# Patient Record
Sex: Female | Born: 2000
Health system: Southern US, Community
[De-identification: ages and names within clinical notes are randomized; demographics above are authoritative.]

## PROBLEM LIST (undated history)

## (undated) DIAGNOSIS — H6092 Unspecified otitis externa, left ear: Secondary | ICD-10-CM

## (undated) DIAGNOSIS — T7840XA Allergy, unspecified, initial encounter: Secondary | ICD-10-CM

## (undated) DIAGNOSIS — E559 Vitamin D deficiency, unspecified: Secondary | ICD-10-CM

## (undated) DIAGNOSIS — F419 Anxiety disorder, unspecified: Secondary | ICD-10-CM

## (undated) HISTORY — DX: Vitamin D deficiency, unspecified: E55.9

## (undated) HISTORY — PX: MANDIBLE SURGERY: SHX707

## (undated) HISTORY — DX: Allergy, unspecified, initial encounter: T78.40XA

## (undated) HISTORY — DX: Anxiety disorder, unspecified: F41.9

## (undated) HISTORY — PX: COSMETIC SURGERY: SHX468

---
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
## 2018-04-12 DIAGNOSIS — M26213 Malocclusion, Angle's class III: Secondary | ICD-10-CM | POA: Diagnosis not present

## 2018-04-12 DIAGNOSIS — M2603 Mandibular hyperplasia: Secondary | ICD-10-CM | POA: Diagnosis not present

## 2018-04-12 DIAGNOSIS — M2602 Maxillary hypoplasia: Secondary | ICD-10-CM | POA: Diagnosis not present

## 2018-04-12 DIAGNOSIS — M26621 Arthralgia of right temporomandibular joint: Secondary | ICD-10-CM | POA: Diagnosis not present

## 2019-06-08 ENCOUNTER — Other Ambulatory Visit: Payer: Self-pay

## 2019-06-08 ENCOUNTER — Ambulatory Visit (INDEPENDENT_AMBULATORY_CARE_PROVIDER_SITE_OTHER): Payer: Commercial Managed Care - PPO | Admitting: Adult Health Nurse Practitioner

## 2019-06-08 ENCOUNTER — Encounter: Payer: Self-pay | Admitting: Adult Health Nurse Practitioner

## 2019-06-08 DIAGNOSIS — Z23 Encounter for immunization: Secondary | ICD-10-CM

## 2019-06-08 DIAGNOSIS — H60332 Swimmer's ear, left ear: Secondary | ICD-10-CM

## 2019-06-08 DIAGNOSIS — H6092 Unspecified otitis externa, left ear: Secondary | ICD-10-CM | POA: Insufficient documentation

## 2019-06-08 HISTORY — DX: Unspecified otitis externa, left ear: H60.92

## 2019-06-08 MED ORDER — NEOMYCIN-POLYMYXIN-HC 1 % OT SOLN: 3.0000 [drp] | Freq: Four times a day (QID) | OTIC | 0 refills | Status: DC

## 2019-06-08 NOTE — Patient Instructions (Signed)
Otitis Externa  Otitis externa is an infection of the outer ear canal. The outer ear canal is the area between the outside of the ear and the eardrum. Otitis externa is sometimes called swimmer's ear. What are the causes? Common causes of this condition include:  Swimming in dirty water.  Moisture in the ear.  An injury to the inside of the ear.  An object stuck in the ear.  A cut or scrape on the outside of the ear. What increases the risk? You are more likely to develop this condition if you go swimming often. What are the signs or symptoms? The first symptom of this condition is often itching in the ear. Later symptoms of the condition include:  Swelling of the ear.  Redness in the ear.  Ear pain. The pain may get worse when you pull on your ear.  Pus coming from the ear. How is this diagnosed? This condition may be diagnosed by examining the ear and testing fluid from the ear for bacteria and funguses. How is this treated? This condition may be treated with:  Antibiotic ear drops. These are often given for 10-14 days.  Medicines to reduce itching and swelling. Follow these instructions at home:  If you were prescribed antibiotic ear drops, use them as told by your health care provider. Do not stop using the antibiotic even if your condition improves.  Take over-the-counter and prescription medicines only as told by your health care provider.  Avoid getting water in your ears as told by your health care provider. This may include avoiding swimming or water sports for a few days.  Keep all follow-up visits as told by your health care provider. This is important. How is this prevented?  Keep your ears dry. Use the corner of a towel to dry your ears after you swim or bathe.  Avoid scratching or putting things in your ear. Doing these things can damage the ear canal or remove the protective wax that lines it, which makes it easier for bacteria and funguses to grow.   Avoid swimming in lakes, polluted water, or pools that may not have enough chlorine. Contact a health care provider if:  You have a fever.  Your ear is still red, swollen, painful, or draining pus after 3 days.  Your redness, swelling, or pain gets worse.  You have a severe headache.  You have redness, swelling, pain, or tenderness in the area behind your ear. Summary  Otitis externa is an infection of the outer ear canal.  Common causes include swimming in dirty water, moisture in the ear, or a cut or scrape in the ear.  Symptoms include pain, redness, and swelling of the ear.  If you were prescribed antibiotic ear drops, use them as told by your health care provider. Do not stop using the antibiotic even if your condition improves. This information is not intended to replace advice given to you by your health care provider. Make sure you discuss any questions you have with your health care provider. Document Released: 08/25/2005 Document Revised: 01/29/2018 Document Reviewed: 01/29/2018 Elsevier Patient Education  2020 Elsevier Inc.  

## 2019-06-08 NOTE — Progress Notes (Signed)
Acute Office Visit  Subjective:    Patient ID: Mary Benitez, female    DOB: [birth year], 18 y.o.   MRN: [id number]  Chief Complaint  Patient presents with  . Otalgia    L ear, started 09/19, ear drops were applied    HPI Patient is in today for left ear pain over the past week.  It feels a little better this a.m. Feels her hearing has diminished somewhat.   No fever, chills, night sweats.  No sore throat.  No sinus pain, pressure, or drainage.  No loss of taste or smell.   Past Medical History:  Diagnosis Date  . External otitis of left ear 06/08/2019    No past surgical history on file.  Family History  Problem Relation Age of Onset  . Hypertension Maternal Grandmother   . Hypertension Maternal Grandfather     Social History   Socioeconomic History  . Marital status: Single    Spouse name: Not on file  . Number of children: Not on file  . Years of education: Not on file  . Highest education level: Not on file  Occupational History  . Not on file  Social Needs  . Financial resource strain: Not on file  . Food insecurity    Worry: Not on file    Inability: Not on file  . Transportation needs    Medical: Not on file    Non-medical: Not on file  Tobacco Use  . Smoking status: Never Smoker  . Smokeless tobacco: Never Used  Substance and Sexual Activity  . Alcohol use: No  . Drug use: No  . Sexual activity: Not on file  Lifestyle  . Physical activity    Days per week: Not on file    Minutes per session: Not on file  . Stress: Not on file  Relationships  . Social Herbalist on phone: Not on file    Gets together: Not on file    Attends religious service: Not on file    Active member of club or organization: Not on file    Attends meetings of clubs or organizations: Not on file    Relationship status: Not on file  . Intimate partner violence    Fear of current or ex partner: Not on file    Emotionally abused: Not on file    Physically abused:  Not on file    Forced sexual activity: Not on file  Other Topics Concern  . Not on file  Social History Narrative  . Not on file    No outpatient medications prior to visit.   No facility-administered medications prior to visit.     No Known Allergies  Review of Systems  Constitutional: Negative.  Negative for chills, diaphoresis and fever.  HENT: Positive for ear pain and hearing loss. Negative for congestion, ear discharge, sinus pain and sore throat.   Eyes: Negative.   Respiratory: Negative.   Skin: Negative.          Objective:    Physical Exam  Constitutional: She appears well-developed and well-nourished.  HENT:  Head: Normocephalic and atraumatic.  Right Ear: Hearing, tympanic membrane and external ear normal.  Left Ear: No tenderness. Tympanic membrane is retracted. A middle ear effusion is present.  Eyes: Conjunctivae, EOM and lids are normal. Lids are everted and swept, no foreign bodies found.  Cardiovascular: Normal rate, regular rhythm, S1 normal and S2 normal.  Pulmonary/Chest: Effort normal. No respiratory distress.  BP 128/83 (BP Location: Right Arm, Patient Position: Sitting, Cuff Size: Normal)   Pulse (!) 130   Temp 97.9 F (36.6 C) (Oral)   Resp 18   Ht _0  (1.575 m)   Wt 118 lb 12.8 oz (53.9 kg)   LMP 05/13/2019   SpO2 98%   BMI 21.73 kg/m  Wt Readings from Last 3 Encounters:  06/08/19 118 lb 12.8 oz (53.9 kg)  09/10/17 123 lb 9.6 oz (56.1 kg) (46 %, Z= -0.09)*  01/05/17 123 lb (55.8 kg) (48 %, Z= -0.04)*   * Growth percentiles are based on CDC (Girls, 2-20 Years) data.    Health Maintenance Due  Topic Date Due  . HIV Screening  01/21/2014  . TETANUS/TDAP  01/21/2018  . INFLUENZA VACCINE  04/09/2019    There are no preventive care reminders to display for this patient.   No results found for: TSH No results found for: WBC, HGB, HCT, MCV, PLT No results found for: NA, K, CHLORIDE, CO2, GLUCOSE, BUN, CREATININE, BILITOT,  ALKPHOS, AST, ALT, PROT, ALBUMIN, CALCIUM, ANIONGAP, EGFR, GFR No results found for: CHOL No results found for: HDL No results found for: LDLCALC No results found for: TRIG No results found for: CHOLHDL No results found for: HGBA1C     Assessment & Plan:   Problem List Items Addressed This Visit      Nervous and Auditory   External otitis of left ear (Chronic)    Other Visit Diagnoses    Need for immunization against influenza       Relevant Orders   Flu Vaccine QUAD 36+ mos IM (Completed)       Meds ordered this encounter  Medications  . NEOMYCIN-POLYMYXIN-HYDROCORTISONE (CORTISPORIN) 1 % SOLN OTIC solution    Sig: Place 3 drops into the left ear 4 (four) times daily.    Dispense:  10 mL    Refill:  0    Will try external Otic solution.  Should have some improvement within 48 hours.  If no improvement, patient to call and we will consider oral antibiotic. Patient and father are inline with this plan.    Glyn Ade, NP

## 2021-12-12 NOTE — Progress Notes (Signed)
? ?New Patient Office Visit ? ?Subjective:  ?Patient ID: Mary Benitez, female    DOB: 2001-03-26  Age: 21 y.o. MRN: [id number] ? ?CC:  ?Chief Complaint  ?Patient presents with  ? Establish Care  ?  Np. Est care. Pt c/o frequent cluster headaches for several months. Pt is fasting  ? ? ?HPI ?Mary Benitez presents for new patient visit to establish care.  Introduced to Publishing rights manager role and practice setting.  All questions answered.  Discussed provider/patient relationship and expectations. ? ?She has been having frequent headaches. They tend to occur during the week. She also works third shift. She states that she has a poor diet. She sleeps with her hair tied in a tight bun. Last week the pain was behind her right eye, however they generally tend to be across her forehead. She takes aspirin throughout the week for the headaches. The headaches started about a year ago. She recently visited the eye doctor and had her eye glass prescription changed. Denies sensitivity to light and sound, nausea, vomiting, and aura. Describes the pain as a band around her forehead. She did have a headache earlier this morning, however it went away. She states that her sleep is not best working third shift and it is hard for her to stay asleep.  ? ?She has a history of anxiety since high school. She does get anxiety attacks, but not frequently. She was never taking medication or seeing a therapist. She does not feel like her symptoms affect her daily life. Anxiety tends to be situational, like with her last interview. She has tried CBD gummies which helps some.  ? ? ?  12/16/2021  ? 11:09 AM 06/08/2019  ? 11:19 AM 09/10/2017  ?  2:12 PM 01/05/2017  ?  5:17 PM  ?Depression screen PHQ 2/9  ?Decreased Interest 0 0 0 0  ?Down, Depressed, Hopeless 0 0 0 0  ?PHQ - 2 Score 0 0 0 0  ?Altered sleeping 1 0    ?Tired, decreased energy 2 0    ?Change in appetite 3 0    ?Feeling bad or failure about yourself  0 0    ?Trouble concentrating 0 0     ?Moving slowly or fidgety/restless 0 0    ?Suicidal thoughts 0 0    ?PHQ-9 Score 6 0    ?Difficult doing work/chores Somewhat difficult Not difficult at all    ? ? ?  12/16/2021  ? 11:09 AM  ?GAD 7 : Generalized Anxiety Score  ?Nervous, Anxious, on Edge 1  ?Control/stop worrying 0  ?Worry too much - different things 1  ?Trouble relaxing 0  ?Restless 0  ?Easily annoyed or irritable 0  ?Afraid - awful might happen 0  ?Total GAD 7 Score 2  ?Anxiety Difficulty Somewhat difficult  ? ? ?Past Medical History:  ?Diagnosis Date  ? External otitis of left ear 06/08/2019  ? Vitamin D deficiency   ? ? ?Past Surgical History:  ?Procedure Laterality Date  ? MANDIBLE SURGERY    ? ? ?Family History  ?Problem Relation Age of Onset  ? Hypertension Maternal Grandmother   ? Hypertension Maternal Grandfather   ? ? ?Social History  ? ?Socioeconomic History  ? Marital status: Single  ?  Spouse name: Not on file  ? Number of children: Not on file  ? Years of education: Not on file  ? Highest education level: Not on file  ?Occupational History  ? Not on file  ?Tobacco Use  ?  Smoking status: Never  ? Smokeless tobacco: Never  ?Vaping Use  ? Vaping Use: Never used  ?Substance and Sexual Activity  ? Alcohol use: Yes  ?  Comment: occasionally  ? Drug use: No  ? Sexual activity: Never  ?Other Topics Concern  ? Not on file  ?Social History Narrative  ? Not on file  ? ?Social Determinants of Health  ? ?Financial Resource Strain: Not on file  ?Food Insecurity: Not on file  ?Transportation Needs: Not on file  ?Physical Activity: Not on file  ?Stress: Not on file  ?Social Connections: Not on file  ?Intimate Partner Violence: Not on file  ? ? ?ROS ?Review of Systems  ?Constitutional:  Positive for fatigue.  ?HENT: Negative.    ?Respiratory: Negative.    ?Cardiovascular: Negative.   ?Gastrointestinal: Negative.   ?Genitourinary: Negative.   ?Musculoskeletal: Negative.   ?Skin: Negative.   ?Neurological:  Positive for headaches. Negative for dizziness.   ?Psychiatric/Behavioral:  Positive for sleep disturbance (works 3rd shift). The patient is nervous/anxious.   ? ?Objective:  ? ?Today's Vitals: BP 133/81 (BP Location: Left Arm, Patient Position: Sitting, Cuff Size: Normal)   Pulse 88   Temp (!) 96.5 ?F (35.8 ?C) (Temporal)   Ht 5\' 2"  (1.575 m)   Wt 125 lb 6.4 oz (56.9 kg)   LMP 12/07/2021 (Exact Date)   SpO2 100%   BMI 22.94 kg/m?  ? ?Physical Exam ?Vitals and nursing note reviewed.  ?Constitutional:   ?   General: She is not in acute distress. ?   Appearance: Normal appearance.  ?HENT:  ?   Head: Normocephalic and atraumatic.  ?   Right Ear: Tympanic membrane, ear canal and external ear normal.  ?   Left Ear: Tympanic membrane, ear canal and external ear normal.  ?   Nose: Nose normal.  ?   Mouth/Throat:  ?   Mouth: Mucous membranes are moist.  ?   Pharynx: Oropharynx is clear.  ?Eyes:  ?   Conjunctiva/sclera: Conjunctivae normal.  ?Cardiovascular:  ?   Rate and Rhythm: Normal rate and regular rhythm.  ?   Pulses: Normal pulses.  ?   Heart sounds: Normal heart sounds.  ?Pulmonary:  ?   Effort: Pulmonary effort is normal.  ?   Breath sounds: Normal breath sounds.  ?Abdominal:  ?   Palpations: Abdomen is soft.  ?   Tenderness: There is no abdominal tenderness. There is no guarding or rebound.  ?Musculoskeletal:     ?   General: Normal range of motion.  ?   Cervical back: Normal range of motion. No tenderness.  ?   Right lower leg: No edema.  ?   Left lower leg: No edema.  ?Lymphadenopathy:  ?   Cervical: No cervical adenopathy.  ?Skin: ?   General: Skin is warm and dry.  ?Neurological:  ?   General: No focal deficit present.  ?   Mental Status: She is alert and oriented to person, place, and time.  ?   Cranial Nerves: No cranial nerve deficit.  ?   Coordination: Coordination normal.  ?   Gait: Gait normal.  ?   Deep Tendon Reflexes: Reflexes normal.  ?Psychiatric:     ?   Mood and Affect: Mood normal.     ?   Behavior: Behavior normal.     ?   Thought  Content: Thought content normal.     ?   Judgment: Judgment normal.  ? ? ?Assessment & Plan:  ? ?  Problem List Items Addressed This Visit   ? ?  ? Other  ? Tension headache  ?  Chronic, ongoing.  Her symptoms are consistent with tension headaches.  Discussed sleep hygiene, getting enough sleep, eating well, exercise, ways to reduce her stress.  She can take ibuprofen or Tylenol as needed for pain.  Handout provided as well.  Follow-up in 3 months or sooner with concerns. ?  ?  ? Vitamin D deficiency  ?  She is taking an over-the-counter vitamin D supplement when she remembers.  We will check vitamin D levels today and adjust regimen based on results ?  ?  ? Relevant Orders  ? VITAMIN D 25 Hydroxy (Vit-D Deficiency, Fractures)  ? Anxiety  ?  Has a history anxiety, that is more situational in nature.  Her PHQ-9 is a 6 and her GAD-7 is a 2.  She denies SI/HI.  She has been controlling her anxiety without medication since high school.  Follow-up with any concerns or worsening symptoms. ?  ?  ? ?Other Visit Diagnoses   ? ? Routine general medical examination at a health care facility    -  Primary  ? Health maintenance reviewed and updated. Tetanus updated today. Discussed diet and exercise. Follow up 1 year  ? Relevant Orders  ? CBC with Differential/Platelet  ? Comprehensive metabolic panel  ? Encounter for lipid screening for cardiovascular disease      ? Screen baseline lipid panel today  ? Relevant Orders  ? Lipid panel  ? Fatigue, unspecified type      ? Discussed sleep hygiene and taking melatonin prn to help with sleep. She does work 3rd shift. Check CMP, CBC, TSH today.   ? Relevant Orders  ? TSH  ? History of non anemic vitamin B12 deficiency      ? Check vitamin B-12 today and treat based on results  ? Relevant Orders  ? Vitamin B12  ? Screening for HIV (human immunodeficiency virus)      ? Check HIV today  ? Relevant Orders  ? HIV Antibody (routine testing w rflx)  ? Encounter for hepatitis C screening test  for low risk patient      ? Check hepatitis C today  ? Relevant Orders  ? Hepatitis C antibody  ? ?  ? ?LABORATORY TESTING:  ?- Pap smear:  declined ? ?IMMUNIZATIONS:   ?- Tdap: Tetanus vaccination status reviewed: Td v

## 2021-12-16 ENCOUNTER — Ambulatory Visit (INDEPENDENT_AMBULATORY_CARE_PROVIDER_SITE_OTHER): Payer: Commercial Managed Care - PPO | Admitting: Nurse Practitioner

## 2021-12-16 ENCOUNTER — Encounter: Payer: Self-pay | Admitting: Nurse Practitioner

## 2021-12-16 VITALS — BP 133/81 | HR 88 | Temp 96.5°F | Ht 62.0 in | Wt 125.4 lb

## 2021-12-16 DIAGNOSIS — F419 Anxiety disorder, unspecified: Secondary | ICD-10-CM | POA: Diagnosis not present

## 2021-12-16 DIAGNOSIS — R5383 Other fatigue: Secondary | ICD-10-CM

## 2021-12-16 DIAGNOSIS — Z23 Encounter for immunization: Secondary | ICD-10-CM

## 2021-12-16 DIAGNOSIS — Z136 Encounter for screening for cardiovascular disorders: Secondary | ICD-10-CM

## 2021-12-16 DIAGNOSIS — Z1159 Encounter for screening for other viral diseases: Secondary | ICD-10-CM

## 2021-12-16 DIAGNOSIS — Z13220 Encounter for screening for lipoid disorders: Secondary | ICD-10-CM | POA: Diagnosis not present

## 2021-12-16 DIAGNOSIS — E559 Vitamin D deficiency, unspecified: Secondary | ICD-10-CM | POA: Diagnosis not present

## 2021-12-16 DIAGNOSIS — Z0000 Encounter for general adult medical examination without abnormal findings: Secondary | ICD-10-CM | POA: Diagnosis not present

## 2021-12-16 DIAGNOSIS — G44209 Tension-type headache, unspecified, not intractable: Secondary | ICD-10-CM

## 2021-12-16 DIAGNOSIS — Z8639 Personal history of other endocrine, nutritional and metabolic disease: Secondary | ICD-10-CM

## 2021-12-16 DIAGNOSIS — Z1329 Encounter for screening for other suspected endocrine disorder: Secondary | ICD-10-CM

## 2021-12-16 DIAGNOSIS — Z114 Encounter for screening for human immunodeficiency virus [HIV]: Secondary | ICD-10-CM

## 2021-12-16 LAB — CBC WITH DIFFERENTIAL/PLATELET
Basophils Absolute: 0.1 10*3/uL (ref 0.0–0.1)
Basophils Relative: 0.8 % (ref 0.0–3.0)
Eosinophils Absolute: 0 10*3/uL (ref 0.0–0.7)
Eosinophils Relative: 0.7 % (ref 0.0–5.0)
HCT: 40.4 % (ref 36.0–46.0)
Hemoglobin: 13.6 g/dL (ref 12.0–15.0)
Lymphocytes Relative: 49.9 % — ABNORMAL HIGH (ref 12.0–46.0)
Lymphs Abs: 3.2 10*3/uL (ref 0.7–4.0)
MCHC: 33.7 g/dL (ref 30.0–36.0)
MCV: 89.6 fl (ref 78.0–100.0)
Monocytes Absolute: 0.3 10*3/uL (ref 0.1–1.0)
Monocytes Relative: 5 % (ref 3.0–12.0)
Neutro Abs: 2.8 10*3/uL (ref 1.4–7.7)
Neutrophils Relative %: 43.6 % (ref 43.0–77.0)
Platelets: 242 10*3/uL (ref 150.0–400.0)
RBC: 4.51 Mil/uL (ref 3.87–5.11)
RDW: 12.4 % (ref 11.5–15.5)
WBC: 6.4 10*3/uL (ref 4.0–10.5)

## 2021-12-16 LAB — COMPREHENSIVE METABOLIC PANEL
ALT: 13 U/L (ref 0–35)
AST: 19 U/L (ref 0–37)
Albumin: 4.9 g/dL (ref 3.5–5.2)
Alkaline Phosphatase: 51 U/L (ref 39–117)
BUN: 12 mg/dL (ref 6–23)
CO2: 23 mEq/L (ref 19–32)
Calcium: 10.4 mg/dL (ref 8.4–10.5)
Chloride: 102 mEq/L (ref 96–112)
Creatinine, Ser: 0.73 mg/dL (ref 0.40–1.20)
GFR: 116.34 mL/min (ref >60.00)
Glucose, Bld: 79 mg/dL (ref 70–99)
Potassium: 3.5 mEq/L (ref 3.5–5.1)
Sodium: 137 mEq/L (ref 135–145)
Total Bilirubin: 0.4 mg/dL (ref 0.2–1.2)
Total Protein: 8 g/dL (ref 6.0–8.3)

## 2021-12-16 LAB — TSH: TSH: 0.97 u[IU]/mL (ref 0.35–5.50)

## 2021-12-16 LAB — LIPID PANEL
Cholesterol: 166 mg/dL (ref 0–200)
HDL: 52.1 mg/dL (ref >39.00)
LDL Cholesterol: 98 mg/dL (ref 0–99)
NonHDL: 114.2
Total CHOL/HDL Ratio: 3
Triglycerides: 79 mg/dL (ref 0.0–149.0)
VLDL: 15.8 mg/dL (ref 0.0–40.0)

## 2021-12-16 LAB — VITAMIN D 25 HYDROXY (VIT D DEFICIENCY, FRACTURES): VITD: 25.16 ng/mL — ABNORMAL LOW (ref 30.00–100.00)

## 2021-12-16 LAB — VITAMIN B12: Vitamin B-12: 215 pg/mL (ref 211–911)

## 2021-12-16 NOTE — Assessment & Plan Note (Signed)
Chronic, ongoing.  Her symptoms are consistent with tension headaches.  Discussed sleep hygiene, getting enough sleep, eating well, exercise, ways to reduce her stress.  She can take ibuprofen or Tylenol as needed for pain.  Handout provided as well.  Follow-up in 3 months or sooner with concerns. ?

## 2021-12-16 NOTE — Patient Instructions (Addendum)
It was great to see you! ? ?See the attached information on tension headaches. Trying to make sure you get enough sleep, eat right and exercise can help prevent the headaches. You can try melatonin to help you sleep. Make sure your room is dark especially since you work third shift and sleep during the day.  ? ?We are checking your labs today and will call you with the results.  ? ?Let's follow-up in 3 months, sooner if you have concerns. ? ?If a referral was placed today, you will be contacted for an appointment. Please note that routine referrals can sometimes take up to 3-4 weeks to process. Please call our office if you haven't heard anything after this time frame. ? ?Take care, ? ?Rodman Pickle, NP ? ?

## 2021-12-16 NOTE — Assessment & Plan Note (Signed)
Has a history anxiety, that is more situational in nature.  Her PHQ-9 is a 6 and her GAD-7 is a 2.  She denies SI/HI.  She has been controlling her anxiety without medication since high school.  Follow-up with any concerns or worsening symptoms. ?

## 2021-12-16 NOTE — Assessment & Plan Note (Signed)
She is taking an over-the-counter vitamin D supplement when she remembers.  We will check vitamin D levels today and adjust regimen based on results ?

## 2021-12-17 LAB — HEPATITIS C ANTIBODY
Hepatitis C Ab: NONREACTIVE
SIGNAL TO CUT-OFF: 0.06 (ref <1.00)

## 2021-12-17 LAB — HIV ANTIBODY (ROUTINE TESTING W REFLEX): HIV 1&2 Ab, 4th Generation: NONREACTIVE

## 2021-12-23 ENCOUNTER — Telehealth: Payer: Self-pay | Admitting: Nurse Practitioner

## 2021-12-23 NOTE — Telephone Encounter (Signed)
Pt is wanting a cb concerning her most recent lab results. Please advise pt at [phone]. ?

## 2021-12-23 NOTE — Telephone Encounter (Signed)
Patient notified VIA phone regarding lab results and recommendations.  No questions. Dm/cma ? ?

## 2021-12-30 ENCOUNTER — Ambulatory Visit
Admission: EM | Admit: 2021-12-30 | Discharge: 2021-12-30 | Disposition: A | Discharge status: Home / Self Care | Payer: Commercial Managed Care - PPO | Attending: Urgent Care | Admitting: Urgent Care

## 2021-12-30 DIAGNOSIS — L249 Irritant contact dermatitis, unspecified cause: Secondary | ICD-10-CM

## 2021-12-30 MED ORDER — HYDROXYZINE HCL 25 MG PO TABS: 12.5000 mg | ORAL_TABLET | Freq: Three times a day (TID) | ORAL | 0 refills | Status: DC | PRN

## 2021-12-30 MED ORDER — BETAMETHASONE DIPROPIONATE 0.05 % EX OINT: TOPICAL_OINTMENT | Freq: Two times a day (BID) | CUTANEOUS | 0 refills | Status: DC

## 2021-12-30 NOTE — ED Provider Notes (Signed)
?  [facility] ? ? ?MRN: [id number] DOB: 10-Sep-2000 ? ?Subjective:  ? ?Mary Benitez is a 21 y.o. female presenting for 2-week history of persistent itchy rash over the left dorsum of her foot.  Patient works in a wooded area, does night shifts.  Cannot recall of any particular inciting event but has exposure to a lot of allergens and insects, animals.  Denies any drainage of pus or bleeding, tenderness. ? ?No current facility-administered medications for this encounter. ?No current outpatient medications on file.  ? ?No Known Allergies ? ?Past Medical History:  ?Diagnosis Date  ? External otitis of left ear 06/08/2019  ? Vitamin D deficiency   ?  ? ?Past Surgical History:  ?Procedure Laterality Date  ? MANDIBLE SURGERY    ? ? ?Family History  ?Problem Relation Age of Onset  ? Hypertension Maternal Grandmother   ? Hypertension Maternal Grandfather   ? ? ?Social History  ? ?Tobacco Use  ? Smoking status: Never  ? Smokeless tobacco: Never  ?Vaping Use  ? Vaping Use: Never used  ?Substance Use Topics  ? Alcohol use: Yes  ?  Comment: occasionally  ? Drug use: No  ? ? ?ROS ? ? ?Objective:  ? ?Vitals: ?BP 127/87 (BP Location: Left Arm)   Pulse 77   Temp 97.8 ?F (36.6 ?C) (Oral)   Resp 17   LMP 12/07/2021 (Exact Date)   SpO2 95%  ? ?Physical Exam ?Constitutional:   ?   General: She is not in acute distress. ?   Appearance: Normal appearance. She is well-developed. She is not ill-appearing, toxic-appearing or diaphoretic.  ?HENT:  ?   Head: Normocephalic and atraumatic.  ?   Nose: Nose normal.  ?   Mouth/Throat:  ?   Mouth: Mucous membranes are moist.  ?Eyes:  ?   General: No scleral icterus.    ?   Right eye: No discharge.     ?   Left eye: No discharge.  ?   Extraocular Movements: Extraocular movements intact.  ?Cardiovascular:  ?   Rate and Rhythm: Normal rate.  ?Pulmonary:  ?   Effort: Pulmonary effort is normal.  ?Skin: ?   General: Skin is warm and dry.  ? ?    ?Neurological:  ?   General: No focal  deficit present.  ?   Mental Status: She is alert and oriented to person, place, and time.  ?Psychiatric:     ?   Mood and Affect: Mood normal.     ?   Behavior: Behavior normal.  ? ?Assessment and Plan :  ? ?PDMP not reviewed this encounter. ? ?1. Irritant contact dermatitis, unspecified trigger   ? ?Recommended topical treatment for an irritant dermatitis with betamethasone ointment.  Use hydroxyzine for itching. Counseled patient on potential for adverse effects with medications prescribed/recommended today, ER and return-to-clinic precautions discussed, patient verbalized understanding. ? ?  ?Wallis Bamberg, PA-C ?12/30/21 1941 ? ?

## 2021-12-30 NOTE — ED Triage Notes (Signed)
2wk pruritic red rash on dorsal surface of left foot with swelling. Pt notes a similar lesion is on her hand, but appears to be improving better than the rash on her foot. Has been using insect bite cream w/o relief. Tried benadryl w/o itch relief. ?

## 2022-03-17 ENCOUNTER — Encounter: Payer: Self-pay | Admitting: Nurse Practitioner

## 2022-03-17 ENCOUNTER — Ambulatory Visit (INDEPENDENT_AMBULATORY_CARE_PROVIDER_SITE_OTHER): Payer: Commercial Managed Care - PPO | Admitting: Nurse Practitioner

## 2022-03-17 VITALS — BP 114/80 | HR 77 | Temp 97.2°F | Wt 129.6 lb

## 2022-03-17 DIAGNOSIS — Z111 Encounter for screening for respiratory tuberculosis: Secondary | ICD-10-CM | POA: Diagnosis not present

## 2022-03-17 DIAGNOSIS — G44209 Tension-type headache, unspecified, not intractable: Secondary | ICD-10-CM | POA: Diagnosis not present

## 2022-03-17 DIAGNOSIS — Z0184 Encounter for antibody response examination: Secondary | ICD-10-CM | POA: Diagnosis not present

## 2022-03-17 NOTE — Patient Instructions (Signed)
It was great to see you!  We are checking your labs today and will let you know the results via mychart/phone.   Good luck with dental assistant school!   Let's follow-up in 1 year, sooner if you have concerns.  If a referral was placed today, you will be contacted for an appointment. Please note that routine referrals can sometimes take up to 3-4 weeks to process. Please call our office if you haven't heard anything after this time frame.  Take care,  Rodman Pickle, NP

## 2022-03-17 NOTE — Assessment & Plan Note (Addendum)
Her headaches have decreased in frequency since her last visit, especially since she stopped working at her prior job.  She was working third shift and is now trying to get back on normal sleeping hours.  She states she did have a headache last night, however it went away this morning.  She can continue to take ibuprofen or Tylenol as needed for pain.  Follow-up if symptoms worsen or with any concerns

## 2022-03-17 NOTE — Progress Notes (Signed)
   Established Patient Office Visit  Subjective   Patient ID: Mary Benitez, female    DOB: 30-Mar-2001  Age: 21 y.o. MRN: [id number]  Chief Complaint  Patient presents with   Follow-up    3 mo f/u tension headaches, insomnia     HPI  Mary Benitez is here to follow-up on tension headaches.   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  She states that she still is having some trouble sleeping, however she is in the process of changing back to a normal sleeping hours.  She had a headache last night into this morning, however it is gone now.    ROS See pertinent positives and negatives per HPI.    Objective:     BP 114/80   Pulse 77   Temp (!) 97.2 F (36.2 C) (Temporal)   Wt 129 lb 9.6 oz (58.8 kg)   LMP 03/08/2022 (Exact Date)   SpO2 100%   BMI 23.70 kg/m    Physical Exam Vitals and nursing note reviewed.  Constitutional:      General: She is not in acute distress.    Appearance: Normal appearance.  HENT:     Head: Normocephalic.  Eyes:     Conjunctiva/sclera: Conjunctivae normal.  Cardiovascular:     Rate and Rhythm: Normal rate and regular rhythm.     Pulses: Normal pulses.     Heart sounds: Normal heart sounds.  Pulmonary:     Effort: Pulmonary effort is normal.     Breath sounds: Normal breath sounds.  Musculoskeletal:     Cervical back: Normal range of motion.  Skin:    General: Skin is warm.  Neurological:     General: No focal deficit present.     Mental Status: She is alert and oriented to person, place, and time.  Psychiatric:        Mood and Affect: Mood normal.        Behavior: Behavior normal.        Thought Content: Thought content normal.        Judgment: Judgment normal.      Assessment & Plan:   Problem List Items Addressed This Visit       Other   Tension headache - Primary    Her headaches have decreased in frequency since her last visit, especially  since she stopped working at her prior job.  She was working third shift and is now trying to get back on normal sleeping hours.  She states she did have a headache last night, however it went away this morning.  She can continue to take ibuprofen or Tylenol as needed for pain.  Follow-up if symptoms worsen or with any concerns      Other Visit Diagnoses     Immunity status testing       We will check hepatitis B surface antibody for school requirements.   Relevant Orders   Hepatitis B surface antibody,qualitative   Screening-pulmonary TB       We will check QuantiFERON gold to rule out TB for school requirements.   Relevant Orders   QuantiFERON-TB Gold Plus       Return in about 1 year (around 03/18/2023) for CPE.    Gerre Scull, NP

## 2022-03-19 ENCOUNTER — Encounter: Payer: Self-pay | Admitting: Nurse Practitioner

## 2022-03-19 NOTE — Telephone Encounter (Signed)
Called and spoke to pt. Pt is scheduled for lab visit for Hep B booster vaccine. Sw, cma

## 2022-03-20 ENCOUNTER — Ambulatory Visit (INDEPENDENT_AMBULATORY_CARE_PROVIDER_SITE_OTHER): Payer: Commercial Managed Care - PPO

## 2022-03-20 DIAGNOSIS — Z23 Encounter for immunization: Secondary | ICD-10-CM | POA: Diagnosis not present

## 2022-03-20 NOTE — Progress Notes (Signed)
Per orders of Alene Mires NP, pt is here for Hep B Vaccine Booster Dose 1. pt received in Left deltoid at 10:50 am. Given by [place] L. CMA/CPT. Pt tolerated He B vaccine Booster Dose 1 well. Pt will return 1 mo from today for 2nd dose booster.

## 2022-03-21 LAB — QUANTIFERON-TB GOLD PLUS
Mitogen-NIL: 5.09 IU/mL
NIL: 0.01 IU/mL
QuantiFERON-TB Gold Plus: NEGATIVE
TB1-NIL: 0 IU/mL
TB2-NIL: 0 IU/mL

## 2022-03-21 LAB — HEPATITIS B SURFACE ANTIBODY,QUALITATIVE: Hep B S Ab: NONREACTIVE

## 2022-03-21 NOTE — Progress Notes (Signed)
Lab results, provider instructions and comments viewed by patient via mychart.

## 2022-04-22 ENCOUNTER — Ambulatory Visit (INDEPENDENT_AMBULATORY_CARE_PROVIDER_SITE_OTHER): Payer: Commercial Managed Care - PPO

## 2022-04-22 DIAGNOSIS — Z23 Encounter for immunization: Secondary | ICD-10-CM

## 2022-04-22 NOTE — Progress Notes (Addendum)
After obtaining consent, and per orders of Lauren McElwee, injection of Hep B#2 given by [place]. Patient instructed to remain in clinic for 20 minutes afterwards, and to report any adverse reaction to me immediately.

## 2022-06-17 ENCOUNTER — Ambulatory Visit: Payer: Commercial Managed Care - PPO

## 2022-09-23 ENCOUNTER — Ambulatory Visit: Payer: Commercial Managed Care - PPO

## 2022-09-25 ENCOUNTER — Ambulatory Visit: Payer: Commercial Managed Care - PPO

## 2022-09-25 ENCOUNTER — Telehealth: Payer: Self-pay

## 2022-09-25 DIAGNOSIS — Z23 Encounter for immunization: Secondary | ICD-10-CM | POA: Diagnosis not present

## 2022-09-25 NOTE — Addendum Note (Signed)
Addended by: Alphonzo Lemmings on: 09/25/2022 04:12 PM   Modules accepted: Orders

## 2022-09-25 NOTE — Telephone Encounter (Signed)
LVM for pt to return my call at the office.

## 2022-09-26 ENCOUNTER — Ambulatory Visit: Payer: Commercial Managed Care - PPO

## 2022-09-26 DIAGNOSIS — Z23 Encounter for immunization: Secondary | ICD-10-CM

## 2022-09-26 NOTE — Progress Notes (Signed)
Pt was erroneously given Hepatitis B Energix-B 17mcg/5ml (Adolescent dose) IM in the left deltoid by Alinda Dooms [place]. PCP made  After obtaining consent, and per orders of Lauren McElwee, injection of Hepatitis B Energix-B 48mcg/5ml (Adolescent dose) given IM in the right deltoid by Alinda Dooms [place]. Patient instructed to remain in clinic for 20 minutes afterwards, and to report any adverse reaction to me immediately. This 2nd dose of 27mcg gives the patient adequate immunization, for a total of 1mcg administered.

## 2022-09-30 NOTE — Progress Notes (Signed)
error 

## 2024-05-06 ENCOUNTER — Encounter: Payer: Self-pay | Admitting: Nurse Practitioner

## 2024-06-10 ENCOUNTER — Encounter: Payer: Self-pay | Admitting: Nurse Practitioner

## 2024-06-10 ENCOUNTER — Ambulatory Visit (INDEPENDENT_AMBULATORY_CARE_PROVIDER_SITE_OTHER): Admitting: Nurse Practitioner

## 2024-06-10 VITALS — BP 126/72 | HR 101 | Temp 97.1°F | Ht 62.0 in | Wt 130.6 lb

## 2024-06-10 DIAGNOSIS — Z136 Encounter for screening for cardiovascular disorders: Secondary | ICD-10-CM

## 2024-06-10 DIAGNOSIS — Z0000 Encounter for general adult medical examination without abnormal findings: Secondary | ICD-10-CM | POA: Insufficient documentation

## 2024-06-10 DIAGNOSIS — F419 Anxiety disorder, unspecified: Secondary | ICD-10-CM | POA: Diagnosis not present

## 2024-06-10 DIAGNOSIS — E559 Vitamin D deficiency, unspecified: Secondary | ICD-10-CM

## 2024-06-10 DIAGNOSIS — Z23 Encounter for immunization: Secondary | ICD-10-CM

## 2024-06-10 MED ORDER — SERTRALINE HCL 25 MG PO TABS: 25.0000 mg | ORAL_TABLET | Freq: Every day | ORAL | 1 refills | Status: DC

## 2024-06-10 MED ORDER — COVID-19 MRNA VACC (MODERNA) 50 MCG/0.5ML IM SUSY: 0.5000 mL | PREFILLED_SYRINGE | Freq: Once | INTRAMUSCULAR | 0 refills | Status: AC

## 2024-06-10 NOTE — Assessment & Plan Note (Signed)
 Check vitamin D and treat based on results. She is not currently taking any supplements

## 2024-06-10 NOTE — Patient Instructions (Signed)
 It was great to see you!  We are checking your labs today and will let you know the results via mychart/phone.   Start zoloft 1 tablet daily for anxiety  I have placed a referral to a therapist   Let's follow-up in 4-6 weeks, sooner if you have concerns.  If a referral was placed today, you will be contacted for an appointment. Please note that routine referrals can sometimes take up to 3-4 weeks to process. Please call our office if you haven't heard anything after this time frame.  Take care,  Tinnie Harada, NP

## 2024-06-10 NOTE — Progress Notes (Signed)
 BP 126/72 (BP Location: Left Arm, Patient Position: Sitting, Cuff Size: Normal)   Pulse (!) 101   Temp (!) 97.1 F (36.2 C)   Ht 5' 2 (1.575 m)   Wt 130 lb 9.6 oz (59.2 kg)   LMP 05/16/2024 (Exact Date)   SpO2 99%   BMI 23.89 kg/m    Subjective:    Patient ID: Mary Benitez, female    DOB: Apr 30, 2001, 23 y.o.   MRN: [id number]  CC: Chief Complaint  Patient presents with   Annual Exam    With fasting labs, no concerns, Covid Rx    HPI: Mary Benitez is a 23 y.o. female presenting on 06/10/2024 for comprehensive medical examination. Current medical complaints include:Anxiety  Anxiety has worsened recently and prevents her from doing some activities like making phone calls and applying for job. She denies trouble sleeping and panic attacks. She has tried CBD gummies which has helped a little. She doesn't take these every day.   She currently lives with: parents Menopausal Symptoms: no  Depression and Anxiety Screen done today and results listed below:     06/10/2024    2:15 PM 12/16/2021   11:09 AM 06/08/2019   11:19 AM 09/10/2017    2:12 PM 01/05/2017    5:17 PM  Depression screen PHQ 2/9  Decreased Interest 0 0 0 0 0  Down, Depressed, Hopeless 1 0 0 0 0  PHQ - 2 Score 1 0 0 0 0  Altered sleeping 0 1 0    Tired, decreased energy 1 2 0    Change in appetite 0 3 0    Feeling bad or failure about yourself  0 0 0    Trouble concentrating 0 0 0    Moving slowly or fidgety/restless 0 0 0    Suicidal thoughts 0 0 0    PHQ-9 Score 2 6 0    Difficult doing work/chores Somewhat difficult Somewhat difficult Not difficult at all        06/10/2024    2:15 PM 12/16/2021   11:09 AM  GAD 7 : Generalized Anxiety Score  Nervous, Anxious, on Edge  1  Control/stop worrying 0 0  Worry too much - different things 1 1  Trouble relaxing 0 0  Restless 0 0  Easily annoyed or irritable 1 0  Afraid - awful might happen 0 0  Total GAD 7 Score  2  Anxiety Difficulty Somewhat difficult  Somewhat difficult    The patient does not have a history of falls. I did not complete a risk assessment for falls. A plan of care for falls was not documented.   Past Medical History:  Past Medical History:  Diagnosis Date   Allergy    Seasonal   Anxiety    External otitis of left ear 06/08/2019   Vitamin D  deficiency     Surgical History:  Past Surgical History:  Procedure Laterality Date   COSMETIC SURGERY  August 5th, 2019   Dental / Jaw surgery   MANDIBLE SURGERY      Medications:  Current Outpatient Medications on File Prior to Visit  Medication Sig   Multiple Vitamin (MULTIVITAMIN ADULT PO) Take by mouth.   No current facility-administered medications on file prior to visit.    Allergies:  No Known Allergies  Social History:  Social History   Socioeconomic History   Marital status: Single    Spouse name: Not on file   Number of children: Not on file  Years of education: Not on file   Highest education level: Associate degree: academic program  Occupational History   Not on file  Tobacco Use   Smoking status: Never   Smokeless tobacco: Never  Vaping Use   Vaping status: Never Used  Substance and Sexual Activity   Alcohol use: Yes    Comment: Rare / On special occasions   Drug use: No   Sexual activity: Never  Other Topics Concern   Not on file  Social History Narrative   Not on file   Social Drivers of Health   Financial Resource Strain: Low Risk  (06/06/2024)   Overall Financial Resource Strain (CARDIA)    Difficulty of Paying Living Expenses: Not hard at all  Food Insecurity: No Food Insecurity (06/06/2024)   Hunger Vital Sign    Worried About Running Out of Food in the Last Year: Never true    Ran Out of Food in the Last Year: Never true  Transportation Needs: No Transportation Needs (06/06/2024)   PRAPARE - Administrator, Civil Service (Medical): No    Lack of Transportation (Non-Medical): No  Physical Activity: Inactive  (06/06/2024)   Exercise Vital Sign    Days of Exercise per Week: 0 days    Minutes of Exercise per Session: Not on file  Stress: Stress Concern Present (06/06/2024)   [employer] of Occupational Health - Occupational Stress Questionnaire    Feeling of Stress: To some extent  Social Connections: Socially Isolated (06/06/2024)   Social Connection and Isolation Panel    Frequency of Communication with Friends and Family: Never    Frequency of Social Gatherings with Friends and Family: Never    Attends Religious Services: Never    Diplomatic Services operational officer: No    Attends Engineer, structural: Not on file    Marital Status: Never married  Catering manager Violence: Not on file   Social History   Tobacco Use  Smoking Status Never  Smokeless Tobacco Never   Social History   Substance and Sexual Activity  Alcohol Use Yes   Comment: Rare / On special occasions    Family History:  Family History  Problem Relation Age of Onset   Hypertension Maternal Grandmother    Hypertension Maternal Grandfather     Past medical history, surgical history, medications, allergies, family history and social history reviewed with patient today and changes made to appropriate areas of the chart.   Review of Systems  Constitutional:  Positive for malaise/fatigue. Negative for fever.  HENT: Negative.    Eyes: Negative.   Respiratory: Negative.    Cardiovascular: Negative.   Gastrointestinal: Negative.   Genitourinary: Negative.   Musculoskeletal: Negative.   Skin: Negative.   Neurological:  Positive for headaches. Negative for dizziness.  Psychiatric/Behavioral:  Negative for depression. The patient is nervous/anxious. The patient does not have insomnia.    All other ROS negative except what is listed above and in the HPI.      Objective:    BP 126/72 (BP Location: Left Arm, Patient Position: Sitting, Cuff Size: Normal)   Pulse (!) 101   Temp (!) 97.1 F (36.2 C)    Ht 5' 2 (1.575 m)   Wt 130 lb 9.6 oz (59.2 kg)   LMP 05/16/2024 (Exact Date)   SpO2 99%   BMI 23.89 kg/m   Wt Readings from Last 3 Encounters:  06/10/24 130 lb 9.6 oz (59.2 kg)  03/17/22 129 lb 9.6 oz (58.8  kg)  12/16/21 125 lb 6.4 oz (56.9 kg)    Physical Exam Vitals and nursing note reviewed.  Constitutional:      General: She is not in acute distress.    Appearance: Normal appearance.  HENT:     Head: Normocephalic and atraumatic.     Right Ear: Tympanic membrane, ear canal and external ear normal.     Left Ear: Tympanic membrane, ear canal and external ear normal.     Mouth/Throat:     Mouth: Mucous membranes are moist.     Pharynx: No posterior oropharyngeal erythema.  Eyes:     Conjunctiva/sclera: Conjunctivae normal.  Cardiovascular:     Rate and Rhythm: Normal rate and regular rhythm.     Pulses: Normal pulses.     Heart sounds: Normal heart sounds.  Pulmonary:     Effort: Pulmonary effort is normal.     Breath sounds: Normal breath sounds.  Abdominal:     Palpations: Abdomen is soft.     Tenderness: There is no abdominal tenderness.  Musculoskeletal:        General: Normal range of motion.     Cervical back: Normal range of motion and neck supple.     Right lower leg: No edema.     Left lower leg: No edema.  Lymphadenopathy:     Cervical: No cervical adenopathy.  Skin:    General: Skin is warm and dry.  Neurological:     General: No focal deficit present.     Mental Status: She is alert and oriented to person, place, and time.     Cranial Nerves: No cranial nerve deficit.     Coordination: Coordination normal.     Gait: Gait normal.  Psychiatric:        Mood and Affect: Mood normal.        Behavior: Behavior normal.        Thought Content: Thought content normal.        Judgment: Judgment normal.     Results for orders placed or performed in visit on 03/17/22  Hepatitis B surface antibody,qualitative   Collection Time: 03/17/22  9:34 AM   Result Value Ref Range   Hep B S Ab NON-REACTIVE NON-REACTIVE  QuantiFERON-TB Gold Plus   Collection Time: 03/17/22  9:34 AM  Result Value Ref Range   QuantiFERON-TB Gold Plus NEGATIVE NEGATIVE   NIL 0.01 IU/mL   Mitogen-NIL 5.09 IU/mL   TB1-NIL 0.00 IU/mL   TB2-NIL 0.00 IU/mL      Assessment & Plan:   Problem List Items Addressed This Visit       Other   Vitamin D  deficiency   Check vitamin D  and treat based on results. She is not currently taking any supplements.       Relevant Orders   VITAMIN D  25 Hydroxy (Vit-D Deficiency, Fractures)   Anxiety   Chronic, not controlled. Anxiety has worsened lately, although she denies panic attacks. Start zoloft 25mg  daily and referral placed to therapy. Discussed possible side effects. Check TSH today. Follow-up in 4-6 weeks.       Relevant Medications   sertraline (ZOLOFT) 25 MG tablet   Other Relevant Orders   Ambulatory referral to Psychology   TSH   Routine general medical examination at a health care facility - Primary   Health maintenance reviewed and updated. Discussed nutrition, exercise. Check CMP, CBC today. Follow-up 1 year.        Relevant Orders   CBC with Differential/Platelet  Comprehensive metabolic panel with GFR   Other Visit Diagnoses       Screening for cardiovascular condition       Screen lipid panel today.   Relevant Orders   Lipid panel     Immunization due       Covid-19 vaccine RX printed.   Relevant Medications   COVID-19 mRNA vaccine [facility]) syringe        Follow up plan: Return in about 4 weeks (around 07/08/2024) for 4-6 weeks , Anxiety.   LABORATORY TESTING:  - Pap smear: done elsewhere  IMMUNIZATIONS:   - Tdap: Tetanus vaccination status reviewed: last tetanus booster within 10 years. - Influenza: Declined - Pneumovax: Not applicable - Prevnar: Not applicable - HPV: Up to date - Shingrix vaccine: Not applicable  SCREENING: -Mammogram: Not applicable  - Colonoscopy:  Not applicable  - Bone Density: Not applicable   PATIENT COUNSELING:   Advised to take 1 mg of folate supplement per day if capable of pregnancy.   Sexuality: Discussed sexually transmitted diseases, partner selection, use of condoms, avoidance of unintended pregnancy  and contraceptive alternatives.   Advised to avoid cigarette smoking.  I discussed with the patient that most people either abstain from alcohol or drink within safe limits (<=14/week and <=4 drinks/occasion for males, <=7/weeks and <= 3 drinks/occasion for females) and that the risk for alcohol disorders and other health effects rises proportionally with the number of drinks per week and how often a drinker exceeds daily limits.  Discussed cessation/primary prevention of drug use and availability of treatment for abuse.   Diet: Encouraged to adjust caloric intake to maintain  or achieve ideal body weight, to reduce intake of dietary saturated fat and total fat, to limit sodium intake by avoiding high sodium foods and not adding table salt, and to maintain adequate dietary potassium and calcium preferably from fresh fruits, vegetables, and low-fat dairy products.    stressed the importance of regular exercise  Injury prevention: Discussed safety belts, safety helmets, smoke detector, smoking near bedding or upholstery.   Dental health: Discussed importance of regular tooth brushing, flossing, and dental visits.    NEXT PREVENTATIVE PHYSICAL DUE IN 1 YEAR. Return in about 4 weeks (around 07/08/2024) for 4-6 weeks , Anxiety.  Minoru Chap A Delonna Ney

## 2024-06-10 NOTE — Assessment & Plan Note (Signed)
 Chronic, not controlled. Anxiety has worsened lately, although she denies panic attacks. Start zoloft 25mg  daily and referral placed to therapy. Discussed possible side effects. Check TSH today. Follow-up in 4-6 weeks.

## 2024-06-10 NOTE — Assessment & Plan Note (Signed)
 Health maintenance reviewed and updated. Discussed nutrition, exercise. Check CMP, CBC today. Follow-up 1 year.

## 2024-06-11 LAB — COMPREHENSIVE METABOLIC PANEL WITH GFR
AG Ratio: 1.7 (calc) (ref 1.0–2.5)
ALT: 14 U/L (ref 6–29)
AST: 18 U/L (ref 10–30)
Albumin: 5 g/dL (ref 3.6–5.1)
Alkaline phosphatase (APISO): 55 U/L (ref 31–125)
BUN: 13 mg/dL (ref 7–25)
CO2: 22 mmol/L (ref 20–32)
Calcium: 10.1 mg/dL (ref 8.6–10.2)
Chloride: 103 mmol/L (ref 98–110)
Creat: 0.72 mg/dL (ref 0.50–0.96)
Globulin: 3 g/dL (ref 1.9–3.7)
Glucose, Bld: 69 mg/dL (ref 65–99)
Potassium: 3.6 mmol/L (ref 3.5–5.3)
Sodium: 136 mmol/L (ref 135–146)
Total Bilirubin: 0.5 mg/dL (ref 0.2–1.2)
Total Protein: 8 g/dL (ref 6.1–8.1)
eGFR: 119 mL/min/1.73m2 (ref >=60)

## 2024-06-11 LAB — CBC WITH DIFFERENTIAL/PLATELET
Absolute Lymphocytes: 3271 {cells}/uL (ref 850–3900)
Absolute Monocytes: 403 {cells}/uL (ref 200–950)
Basophils Absolute: 63 {cells}/uL (ref 0–200)
Basophils Relative: 0.8 %
Eosinophils Absolute: 47 {cells}/uL (ref 15–500)
Eosinophils Relative: 0.6 %
HCT: 41.9 % (ref 35.0–45.0)
Hemoglobin: 14.4 g/dL (ref 11.7–15.5)
MCH: 30.5 pg (ref 27.0–33.0)
MCHC: 34.4 g/dL (ref 32.0–36.0)
MCV: 88.8 fL (ref 80.0–100.0)
MPV: 11.7 fL (ref 7.5–12.5)
Monocytes Relative: 5.1 %
Neutro Abs: 4116 {cells}/uL (ref 1500–7800)
Neutrophils Relative %: 52.1 %
Platelets: 238 Thousand/uL (ref 140–400)
RBC: 4.72 Million/uL (ref 3.80–5.10)
RDW: 12.4 % (ref 11.0–15.0)
Total Lymphocyte: 41.4 %
WBC: 7.9 Thousand/uL (ref 3.8–10.8)

## 2024-06-11 LAB — LIPID PANEL
Cholesterol: 172 mg/dL (ref <200)
HDL: 46 mg/dL — ABNORMAL LOW (ref >=50)
LDL Cholesterol (Calc): 109 mg/dL — ABNORMAL HIGH
Non-HDL Cholesterol (Calc): 126 mg/dL (ref <130)
Total CHOL/HDL Ratio: 3.7 (calc) (ref <5.0)
Triglycerides: 81 mg/dL (ref <150)

## 2024-06-11 LAB — VITAMIN D 25 HYDROXY (VIT D DEFICIENCY, FRACTURES): Vit D, 25-Hydroxy: 36 ng/mL (ref 30–100)

## 2024-06-11 LAB — TSH: TSH: 0.71 m[IU]/L

## 2024-06-13 ENCOUNTER — Ambulatory Visit: Payer: Self-pay | Admitting: Nurse Practitioner

## 2024-07-03 ENCOUNTER — Other Ambulatory Visit: Payer: Self-pay | Admitting: Nurse Practitioner

## 2024-07-04 NOTE — Telephone Encounter (Addendum)
 Requesting: SERTRALINE HCL 25 MG TABLET  Last Visit: 06/10/2024 Next Visit: 07/22/2024 Last Refill: 06/10/2024  Please Advise    Pharmacy comment: REQUEST FOR 90 DAYS PRESCRIPTION.

## 2024-07-22 ENCOUNTER — Ambulatory Visit (INDEPENDENT_AMBULATORY_CARE_PROVIDER_SITE_OTHER): Admitting: Nurse Practitioner

## 2024-07-22 ENCOUNTER — Encounter: Payer: Self-pay | Admitting: Nurse Practitioner

## 2024-07-22 VITALS — BP 124/82 | HR 87 | Temp 97.2°F | Ht 62.0 in | Wt 129.6 lb

## 2024-07-22 DIAGNOSIS — G44209 Tension-type headache, unspecified, not intractable: Secondary | ICD-10-CM | POA: Diagnosis not present

## 2024-07-22 DIAGNOSIS — F419 Anxiety disorder, unspecified: Secondary | ICD-10-CM

## 2024-07-22 DIAGNOSIS — J302 Other seasonal allergic rhinitis: Secondary | ICD-10-CM | POA: Diagnosis not present

## 2024-07-22 MED ORDER — FLUTICASONE PROPIONATE 50 MCG/ACT NA SUSP: 2.0000 | Freq: Every day | NASAL | 6 refills | Status: AC

## 2024-07-22 NOTE — Patient Instructions (Signed)
 It was great to see you!  I have placed a referral to an allergist  Start flonase  nasal spray daily, you can still take benadryl at night as needed   When you are ready to start the zoloft, you can take this once a day   I am giving you the information for the therapist   Let's follow-up in 2 months, sooner if you have concerns.  If a referral was placed today, you will be contacted for an appointment. Please note that routine referrals can sometimes take up to 3-4 weeks to process. Please call our office if you haven't heard anything after this time frame.  Take care,  Tinnie Harada, NP

## 2024-07-22 NOTE — Progress Notes (Signed)
 Established Patient Office Visit  Subjective   Patient ID: Mary Benitez, female    DOB: 12/26/2000  Age: 23 y.o. MRN: [id number]  Chief Complaint  Patient presents with   Anxiety    Follow up, concerns with allergies    HPI Discussed the use of AI scribe software for clinical note transcription with the patient, who gave verbal consent to proceed.  History of Present Illness   Mary Benitez is a 23 year old female who presents with persistent allergies and anxiety.  She experiences persistent allergy symptoms since the summer, including nasal congestion, headaches, throat irritation, and itchy, watery eyes, which have not improved with colder weather. Benadryl at night aids sleep, but other allergy medications are ineffective even at increased dosages. She has not tried nasal sprays like Flonase .  She experiences ongoing anxiety, unchanged over the past month, and is hesitant to start Zoloft due to concerns about side effects and interactions with aspirin. She frequently uses 'Goody Powders' for headache relief. She wears glasses while driving but not consistently otherwise, which may contribute to her headaches. She is anxious about starting medication for anxiety.  She has been contacted by a therapy service but was unsure if the message was intended for her due to a name discrepancy.        07/22/2024    2:24 PM 06/10/2024    2:15 PM 12/16/2021   11:09 AM 06/08/2019   11:19 AM 09/10/2017    2:12 PM  Depression screen PHQ 2/9  Decreased Interest 0 0 0 0 0  Down, Depressed, Hopeless 0 1 0 0 0  PHQ - 2 Score 0 1 0 0 0  Altered sleeping 1 0 1 0   Tired, decreased energy 1 1 2  0   Change in appetite 0 0 3 0   Feeling bad or failure about yourself  0 0 0 0   Trouble concentrating 0 0 0 0   Moving slowly or fidgety/restless 0 0 0 0   Suicidal thoughts 0 0 0 0   PHQ-9 Score 2 2  6   0    Difficult doing work/chores Somewhat difficult Somewhat difficult Somewhat difficult Not  difficult at all      Data saved with a previous flowsheet row definition      07/22/2024    2:25 PM 06/10/2024    2:15 PM 12/16/2021   11:09 AM  GAD 7 : Generalized Anxiety Score  Nervous, Anxious, on Edge 1  1  Control/stop worrying 0 0 0  Worry too much - different things 1 1 1   Trouble relaxing 0 0 0  Restless 0 0 0  Easily annoyed or irritable 0 1 0  Afraid - awful might happen 0 0 0  Total GAD 7 Score 2  2  Anxiety Difficulty Somewhat difficult Somewhat difficult Somewhat difficult      ROS See pertinent positives and negatives per HPI.    Objective:     BP 124/82 (BP Location: Right Arm, Patient Position: Sitting, Cuff Size: Normal)   Pulse 87   Temp (!) 97.2 F (36.2 C)   Ht 5' 2 (1.575 m)   Wt 129 lb 9.6 oz (58.8 kg)   LMP 07/13/2024 (Exact Date)   SpO2 99%   BMI 23.70 kg/m    Physical Exam Vitals and nursing note reviewed.  Constitutional:      General: She is not in acute distress.    Appearance: Normal appearance.  HENT:  Head: Normocephalic.  Eyes:     Conjunctiva/sclera: Conjunctivae normal.  Cardiovascular:     Rate and Rhythm: Normal rate and regular rhythm.     Pulses: Normal pulses.     Heart sounds: Normal heart sounds.  Pulmonary:     Effort: Pulmonary effort is normal.     Breath sounds: Normal breath sounds.  Musculoskeletal:     Cervical back: Normal range of motion.  Skin:    General: Skin is warm.  Neurological:     General: No focal deficit present.     Mental Status: She is alert and oriented to person, place, and time.  Psychiatric:        Mood and Affect: Mood normal.        Behavior: Behavior normal.        Thought Content: Thought content normal.        Judgment: Judgment normal.     Assessment & Plan:   Problem List Items Addressed This Visit       Other   Tension headache   She experiences frequent headaches, possibly tension-related, associated with inconsistent glasses use, contributing to eye strain.  She is advised to wear glasses consistently to reduce eye strain and to limit NSAIDs to twice a week or less.       Anxiety   Chronic, not controlled. Her anxiety remains unchanged. She has concerns about Zoloft side effects, including nausea and NSAID interaction. Nausea is common but typically resolves in days. Zoloft is not usually sedating. She is hesitant to start due to anxiety about side effects. Start Zoloft 25mg  daily when ready. She is advised to avoid daily NSAIDs while on Zoloft and recommended to use Tylenol for headaches. A virtual follow-up is offered in four weeks to assess Zoloft tolerance and effectiveness.      Seasonal allergies - Primary   She experiences nasal congestion, headaches, throat clearing, and watery eyes unresponsive to daily allergy medications. Benadryl provides some relief but causes drowsiness. Start Flonase  nasal spray daily. Continue Benadryl at night as needed. She is referred to an allergist for further evaluation and management.      Relevant Orders   Ambulatory referral to Allergy    Return in about 2 months (around 09/21/2024) for Anxiety.    Tinnie DELENA Harada, NP

## 2024-07-24 DIAGNOSIS — J302 Other seasonal allergic rhinitis: Secondary | ICD-10-CM | POA: Insufficient documentation

## 2024-07-24 NOTE — Assessment & Plan Note (Signed)
 She experiences frequent headaches, possibly tension-related, associated with inconsistent glasses use, contributing to eye strain. She is advised to wear glasses consistently to reduce eye strain and to limit NSAIDs to twice a week or less.

## 2024-07-24 NOTE — Assessment & Plan Note (Signed)
 Chronic, not controlled. Her anxiety remains unchanged. She has concerns about Zoloft side effects, including nausea and NSAID interaction. Nausea is common but typically resolves in days. Zoloft is not usually sedating. She is hesitant to start due to anxiety about side effects. Start Zoloft 25mg  daily when ready. She is advised to avoid daily NSAIDs while on Zoloft and recommended to use Tylenol for headaches. A virtual follow-up is offered in four weeks to assess Zoloft tolerance and effectiveness.

## 2024-07-24 NOTE — Assessment & Plan Note (Signed)
 She experiences nasal congestion, headaches, throat clearing, and watery eyes unresponsive to daily allergy medications. Benadryl provides some relief but causes drowsiness. Start Flonase  nasal spray daily. Continue Benadryl at night as needed. She is referred to an allergist for further evaluation and management.

## 2024-08-19 ENCOUNTER — Ambulatory Visit: Payer: Self-pay

## 2024-08-19 NOTE — Telephone Encounter (Signed)
 First attempt to contact patient for triage. LVM for return call to [phone]. Placed in callbacks for additional attempts   Copied from CRM #[id number]. Topic: Clinical - Red Word Triage >> Aug 19, 2024  4:03 PM Roselie BROCKS wrote: Red Word that prompted transfer to Nurse Triage: Patient made an acute appnt for 12-31 but notes on it from provider states she needs to call back for NT, and patient is stating she is having enhanced anxiety ,feeling anxious, heat papulations, and jitteriness. >> Aug 19, 2024  4:23 PM Roselie BROCKS wrote: Patient disconnected while on hold. Extreme anxiety attack and heart palpitations, no  answer on  call back.

## 2024-08-22 ENCOUNTER — Ambulatory Visit
Admission: RE | Admit: 2024-08-22 | Discharge: 2024-08-22 | Disposition: A | Discharge status: Home / Self Care | Source: Ambulatory Visit | Attending: Nurse Practitioner

## 2024-08-22 VITALS — BP 132/84 | HR 85 | Temp 98.1°F | Resp 17

## 2024-08-22 DIAGNOSIS — F411 Generalized anxiety disorder: Secondary | ICD-10-CM | POA: Diagnosis not present

## 2024-08-22 DIAGNOSIS — F410 Panic disorder [episodic paroxysmal anxiety] without agoraphobia: Secondary | ICD-10-CM

## 2024-08-22 DIAGNOSIS — G44229 Chronic tension-type headache, not intractable: Secondary | ICD-10-CM

## 2024-08-22 MED ORDER — IBUPROFEN 100 MG/5ML PO SUSP: 800.0000 mg | Freq: Three times a day (TID) | ORAL | 0 refills | Status: AC | PRN

## 2024-08-22 MED ORDER — HYDROXYZINE HCL 10 MG PO TABS: 10.0000 mg | ORAL_TABLET | Freq: Three times a day (TID) | ORAL | 0 refills | Status: AC | PRN

## 2024-08-22 NOTE — ED Triage Notes (Signed)
 Pt was started on Zoloft  the week before thanksgiving and took it for two days but began having heart palpitations and trembling. Since then she has had intermittent migraines, heart palpitations, nauseous, and having panic attacks since then.   Currently experiencing heart palpitations. States she had panic attack this morning.

## 2024-08-22 NOTE — ED Provider Notes (Addendum)
 [facility]    CSN: [id number] Arrival date & time: 08/22/24  1155      History   Chief Complaint Chief Complaint  Patient presents with   Anxiety    Heart palpitations - Entered by patient    HPI Mary Benitez is a 23 y.o. female.   Discussed the use of AI scribe software for clinical note transcription with the patient, who gave verbal consent to proceed.   History is provided by the patient and her mother. The patient presents with palpitations, anxiety, and panic attacks that have been ongoing for some time, with this being her second panic attack episode. She was seen by her primary care provider in October for a routine physical, during which her anxiety was addressed. The anxiety has been chronic and not well controlled, with recent worsening. At that visit, she was prescribed sertraline  (Zoloft ) 25 mg daily and referred to psychology. Blood work at that time, including CBC, CMP, and TSH, was normal.  She was seen again by her primary care provider on 11/14 for frequent headaches, which were felt to be tension-related, and her anxiety was again discussed. At that time, she had not started the prescribed sertraline  due to reluctance. After that visit, she began taking sertraline  but stopped after two days due to development of palpitations, tremors, headaches, and nausea. Around the same time, she also discontinued Goody Powders, which she had been using regularly for headaches and which contain caffeine.  Last night, the patient experienced chest heaviness and difficulty falling asleep. This morning around 8:30 AM, she awoke with panic symptoms that gradually improved. During these episodes, she reports shortness of breath, palpitations, and tremors, all of which resolved spontaneously. She has also experienced ongoing nausea and reports a headache a few days ago that lasted for several days with associated nausea. She describes baseline anxiety with panic attacks that  occur unexpectedly. She has been contacted by the psychologist's office but has not yet scheduled an appointment. She denies recent major life stressors, changes in work or activities, and denies suicidal or homicidal ideation. Appetite and fluid intake are adequate. She denies tobacco use, vaping, alcohol use, or illicit drug use, including marijuana and cocaine.  The following sections of the patient's history were reviewed and updated as appropriate: allergies, current medications, past family history, past medical history, past social history, past surgical history, and problem list.      Past Medical History:  Diagnosis Date   Allergy    Seasonal   Anxiety    External otitis of left ear 06/08/2019   Vitamin D  deficiency     Patient Active Problem List   Diagnosis Date Noted   Seasonal allergies 07/24/2024   Routine general medical examination at a health care facility 06/10/2024   Tension headache 12/16/2021   Vitamin D  deficiency 12/16/2021   Anxiety 12/16/2021    Past Surgical History:  Procedure Laterality Date   COSMETIC SURGERY  August 5th, 2019   Dental / Jaw surgery   MANDIBLE SURGERY      OB History   No obstetric history on file.      Home Medications    Prior to Admission medications  Medication Sig Start Date End Date Taking? Authorizing Provider  hydrOXYzine  (ATARAX ) 10 MG tablet Take 1 tablet (10 mg total) by mouth every 8 (eight) hours as needed for anxiety (sleep). 08/22/24  Yes Iola Lukes, FNP  ibuprofen  (ADVIL ) 100 MG/5ML suspension Take 40 mLs (800 mg total) by mouth  every 8 (eight) hours as needed (headache). 08/22/24  Yes Iola Lukes, FNP  fluticasone  (FLONASE ) 50 MCG/ACT nasal spray Place 2 sprays into both nostrils daily. 07/22/24   McElwee, Tinnie LABOR, NP  Multiple Vitamin (MULTIVITAMIN ADULT PO) Take by mouth.    [provider]    Family History Family History  Problem Relation Age of Onset   Hypertension Maternal  Grandmother    Hypertension Maternal Grandfather     Social History Social History[1]   Allergies   Patient has no known allergies.   Review of Systems Review of Systems  Constitutional:  Positive for fatigue. Negative for activity change and appetite change (decreased but drinking well).  Respiratory:  Positive for shortness of breath. Negative for wheezing.   Cardiovascular:  Positive for palpitations. Negative for chest pain and leg swelling.  Gastrointestinal:  Positive for nausea. Negative for blood in stool and vomiting.       No symptoms of acid reflux   Neurological:  Positive for tremors and headaches. Negative for syncope.  Psychiatric/Behavioral:  Positive for sleep disturbance. Negative for self-injury and suicidal ideas. The patient is nervous/anxious.        No significant life stressors   All other systems reviewed and are negative.    Physical Exam Triage Vital Signs ED Triage Vitals  Encounter Vitals Group     BP 08/22/24 1233 132/84     Girls Systolic BP Percentile --      Girls Diastolic BP Percentile --      Boys Systolic BP Percentile --      Boys Diastolic BP Percentile --      Pulse Rate 08/22/24 1233 85     Resp 08/22/24 1233 17     Temp 08/22/24 1233 98.1 F (36.7 C)     Temp Source 08/22/24 1233 Oral     SpO2 08/22/24 1233 98 %     Weight --      Height --      Head Circumference --      Peak Flow --      Pain Score 08/22/24 1232 0     Pain Loc --      Pain Education --      Exclude from Growth Chart --    No data found.  Updated Vital Signs BP 132/84 (BP Location: Right Arm)   Pulse 85   Temp 98.1 F (36.7 C) (Oral)   Resp 17   LMP 08/11/2024 (Exact Date)   SpO2 98%   Visual Acuity Right Eye Distance:   Left Eye Distance:   Bilateral Distance:    Right Eye Near:   Left Eye Near:    Bilateral Near:     Physical Exam Vitals reviewed.  Constitutional:      General: She is awake. She is not in acute distress.     Appearance: Normal appearance. She is well-developed. She is not ill-appearing, toxic-appearing or diaphoretic.  HENT:     Head: Normocephalic.     Right Ear: Hearing normal.     Left Ear: Hearing normal.     Nose: Nose normal.     Mouth/Throat:     Mouth: Mucous membranes are moist.  Eyes:     General: Vision grossly intact.     Conjunctiva/sclera: Conjunctivae normal.  Cardiovascular:     Rate and Rhythm: Normal rate and regular rhythm.     Pulses: Normal pulses.     Heart sounds: Normal heart sounds.  Pulmonary:  Effort: Pulmonary effort is normal. No respiratory distress.     Breath sounds: Normal breath sounds and air entry.     Comments: Respirations even and unlabored  Abdominal:     Palpations: Abdomen is soft.     Tenderness: There is no right CVA tenderness or left CVA tenderness.  Musculoskeletal:        General: Normal range of motion.     Cervical back: Normal, normal range of motion and neck supple.     Thoracic back: Normal.     Lumbar back: Tenderness present. No swelling, deformity, lacerations or spasms. Normal range of motion. Negative right straight leg raise test and negative left straight leg raise test.     Right lower leg: No edema.     Left lower leg: No edema.  Skin:    General: Skin is warm and dry.  Neurological:     General: No focal deficit present.     Mental Status: She is alert and oriented to person, place, and time.     Cranial Nerves: Cranial nerves 2-12 are intact.     Sensory: Sensation is intact.     Motor: Motor function is intact. No weakness.     Coordination: Coordination is intact.     Gait: Gait is intact.  Psychiatric:        Attention and Perception: Attention and perception normal.        Mood and Affect: Mood and affect normal.        Speech: Speech normal.        Behavior: Behavior normal. Behavior is cooperative.        Thought Content: Thought content normal.        Cognition and Memory: Cognition normal.      UC  Treatments / Results  Labs (all labs ordered are listed, but only abnormal results are displayed) Labs Reviewed - No data to display  EKG   Radiology No results found.  Procedures ED EKG  Date/Time: 08/22/2024 4:25 PM  Performed by: Iola Lukes, FNP Authorized by: Iola Lukes, FNP   Rate:    ECG rate:  73   ECG rate assessment: normal   Rhythm:    Rhythm: sinus rhythm   Ectopy:    Ectopy: none   QRS:    QRS axis:  Normal   QRS intervals:  Normal   QRS conduction: normal   ST segments:    ST segments:  Normal T waves:    T waves: normal   Q waves:    Abnormal Q-waves: not present    (including critical care time)  Medications Ordered in UC Medications - No data to display  Initial Impression / Assessment and Plan / UC Course  I have reviewed the triage vital signs and the nursing notes.  Pertinent labs & imaging results that were available during my care of the patient were reviewed by me and considered in my medical decision making (see chart for details).     The patient presents with chronic anxiety and recurrent panic attacks associated with palpitations, tremors, chest heaviness, shortness of breath, nausea, and sleep disturbance. Symptoms have worsened recently, with this being her second panic attack episode. Prior evaluation by primary care included normal laboratory studies and initiation of sertraline , which she discontinued after two days due to adverse effects. Concurrent cessation of caffeine-containing Goody Powders may have contributed to withdrawal symptoms. An EKG performed today is normal, making a cardiac etiology for her palpitations unlikely. She  is currently afebrile, hemodynamically stable, nontoxic, and denies suicidal or homicidal ideation. The overall presentation is most consistent with anxiety with panic attacks and associated tension-type headaches, without evidence of acute medical or neurologic pathology.  Management includes  initiation of hydroxyzine  as needed for anxiety and sleep, with counseling regarding sedation and safety precautions. Sertraline  will be avoided at this time, and further medication options will be discussed with her primary care provider at the upcoming appointment on 08/25/24. She was strongly encouraged to pursue outpatient therapy using the referral already provided, with education on selecting an appropriate therapist. Vitamin D  supplementation was discussed given low-normal levels. For headache management, ibuprofen  was recommended as needed, taking into account her preference for non-pill formulations. She was advised to follow up closely with primary care as scheduled. Emergency evaluation is warranted for worsening or persistent chest pain, shortness of breath, syncope, new neurologic symptoms, or any thoughts of self-harm or harm to others.  Today's evaluation has revealed no signs of a dangerous process. Discussed diagnosis with patient and/or guardian. Patient and/or guardian aware of their diagnosis, possible red flag symptoms to watch out for and need for close follow up. Patient and/or guardian understands verbal and written discharge instructions. Patient and/or guardian comfortable with plan and disposition.  Patient and/or guardian has a clear mental status at this time, good insight into illness (after discussion and teaching) and has clear judgment to make decisions regarding their care  Documentation was completed with the aid of voice recognition software. Transcription may contain typographical errors.  Final Clinical Impressions(s) / UC Diagnoses   Final diagnoses:  Generalized anxiety disorder  Panic attacks  Chronic tension-type headache, not intractable     Discharge Instructions      You were seen today for ongoing anxiety with panic attacks. Your symptoms, including palpitations, shaking, chest heaviness, shortness of breath, nausea, headaches, and sleep difficulty, are  consistent with anxiety and panic rather than a heart or neurologic problem. Your EKG today was normal, and prior lab work was reassuring. Some recent symptoms may also be related to stopping caffeine-containing headache powders.  Take hydroxyzine  as prescribed when you feel anxious or are having trouble sleeping. This medication can make you drowsy, so avoid driving, school, or other activities that require full alertness after taking it. Continue to avoid sertraline  for now and discuss alternative medication options with your primary care provider at your scheduled appointment on 08/25/24. For headaches, you may use ibuprofen  as needed. Gentle stress-reduction techniques such as slow breathing, grounding exercises, quiet rest, limiting caffeine, staying hydrated, and maintaining a regular sleep schedule may also help reduce symptoms. Vitamin D  supplementation may be helpful as your recent level was on the low-end of normal.   It is important to follow through with therapy using the referral already provided, as counseling is a key part of managing anxiety and panic attacks. Be sure to find a therapist that works best for you! Use psychology today as we discussed to find one that you may like should if you are not satisfied with the one you have been referred to. Go to the emergency department right away if you develop severe or persistent chest pain, trouble breathing, fainting, new weakness or numbness, confusion, or if you have any thoughts of hurting yourself or someone else, or if your symptoms suddenly worsen in a concerning way.     ED Prescriptions     Medication Sig Dispense Auth. Provider   hydrOXYzine  (ATARAX ) 10 MG tablet Take 1  tablet (10 mg total) by mouth every 8 (eight) hours as needed for anxiety (sleep). 12 tablet Iola Lukes, FNP   ibuprofen  (ADVIL ) 100 MG/5ML suspension Take 40 mLs (800 mg total) by mouth every 8 (eight) hours as needed (headache). 273 mL Iola Lukes,  FNP      PDMP not reviewed this encounter.     Iola Lukes, [place] 08/22/24 1620     [1]  Social History Tobacco Use   Smoking status: Never   Smokeless tobacco: Never  Vaping Use   Vaping status: Never Used  Substance Use Topics   Alcohol use: Yes    Comment: Rare / On special occasions   Drug use: No     Iola Lukes, FNP 08/22/24 1626

## 2024-08-22 NOTE — Telephone Encounter (Signed)
 Per Patient's chart she went to urgent care today.

## 2024-08-22 NOTE — Discharge Instructions (Addendum)
 You were seen today for ongoing anxiety with panic attacks. Your symptoms, including palpitations, shaking, chest heaviness, shortness of breath, nausea, headaches, and sleep difficulty, are consistent with anxiety and panic rather than a heart or neurologic problem. Your EKG today was normal, and prior lab work was reassuring. Some recent symptoms may also be related to stopping caffeine-containing headache powders.  Take hydroxyzine  as prescribed when you feel anxious or are having trouble sleeping. This medication can make you drowsy, so avoid driving, school, or other activities that require full alertness after taking it. Continue to avoid sertraline  for now and discuss alternative medication options with your primary care provider at your scheduled appointment on 08/25/24. For headaches, you may use ibuprofen  as needed. Gentle stress-reduction techniques such as slow breathing, grounding exercises, quiet rest, limiting caffeine, staying hydrated, and maintaining a regular sleep schedule may also help reduce symptoms. Vitamin D  supplementation may be helpful as your recent level was on the low-end of normal.   It is important to follow through with therapy using the referral already provided, as counseling is a key part of managing anxiety and panic attacks. Be sure to find a therapist that works best for you! Use psychology today as we discussed to find one that you may like should if you are not satisfied with the one you have been referred to. Go to the emergency department right away if you develop severe or persistent chest pain, trouble breathing, fainting, new weakness or numbness, confusion, or if you have any thoughts of hurting yourself or someone else, or if your symptoms suddenly worsen in a concerning way.

## 2024-08-25 ENCOUNTER — Ambulatory Visit: Admitting: Nurse Practitioner

## 2024-08-25 ENCOUNTER — Encounter: Payer: Self-pay | Admitting: Nurse Practitioner

## 2024-08-25 VITALS — BP 110/80 | HR 97 | Temp 97.4°F | Ht 62.0 in | Wt 126.4 lb

## 2024-08-25 DIAGNOSIS — F419 Anxiety disorder, unspecified: Secondary | ICD-10-CM

## 2024-08-25 NOTE — Assessment & Plan Note (Signed)
 Chronic, not controlled. Symptoms include hyperventilation, palpitations, shortness of breath, and chest tightness. Zoloft  was discontinued due to nausea and increased anxiety. Hydroxyzine  is prescribed for acute episodes, though it may cause drowsiness, so caution is advised. Non-pharmacological interventions such as meditation, deep breathing, and journaling are encouraged. She is referred to a therapist for ongoing management and support. Information on behavioral health urgent care for acute anxiety episodes is provided.

## 2024-08-25 NOTE — Progress Notes (Signed)
 Established Patient Office Visit  Subjective   Patient ID: Mary Benitez, female    DOB: Aug 14, 2001  Age: 23 y.o. MRN: [id number]  Chief Complaint  Patient presents with   Anxiety    Follow up and having issues with anxiety that has got worse, nausea, headaches-went to urgent care on 08/22/24    HPI Discussed the use of AI scribe software for clinical note transcription with the patient, who gave verbal consent to proceed.  History of Present Illness   Mary Benitez is a 23 year old female with anxiety who presents with worsening panic attacks. She is accompanied by her mother.  She had her first panic attack the week before Thanksgiving and a second a few days ago. Symptoms include hyperventilation, palpitations, sweating, shortness of breath, and chest tightness that can persist for a couple of days. Her anxiety has become more persistent and is now focused on fear of future attacks.  She briefly started Zoloft  for anxiety but stopped after two days due to nausea. She links the onset of panic attacks to starting Zoloft , though she understands it is no longer in her system.  She recently stopped using Goody powders, which she took for a headache while staying up late for homework. The morning after, she noticed an adrenaline rush and shortness of breath. Since stopping caffeine, she has felt jittery and had headaches, which she attributes to caffeine withdrawal.  She went to urgent care a few days ago and was prescribed hydroxyzine  as needed for anxiety but has not picked it up. EKG was checked and was normal.        08/25/2024   11:58 AM 07/22/2024    2:24 PM 06/10/2024    2:15 PM 12/16/2021   11:09 AM 06/08/2019   11:19 AM  Depression screen PHQ 2/9  Decreased Interest 1 0 0 0 0  Down, Depressed, Hopeless 2 0 1 0 0  PHQ - 2 Score 3 0 1 0 0  Altered sleeping 2 1 0 1 0  Tired, decreased energy 2 1 1 2  0  Change in appetite 2 0 0 3 0  Feeling bad or failure about yourself  0 0  0 0 0  Trouble concentrating 1 0 0 0 0  Moving slowly or fidgety/restless 2 0 0 0 0  Suicidal thoughts 0 0 0 0 0  PHQ-9 Score 12 2 2  6   0   Difficult doing work/chores Very difficult Somewhat difficult Somewhat difficult Somewhat difficult Not difficult at all     Data saved with a previous flowsheet row definition      08/25/2024   11:58 AM 07/22/2024    2:25 PM 06/10/2024    2:15 PM 12/16/2021   11:09 AM  GAD 7 : Generalized Anxiety Score  Nervous, Anxious, on Edge 3 1  1   Control/stop worrying 1 0 0 0  Worry too much - different things 1 1 1 1   Trouble relaxing 2 0 0 0  Restless 0 0 0 0  Easily annoyed or irritable 0 0 1 0  Afraid - awful might happen 1 0 0 0  Total GAD 7 Score 8 2  2   Anxiety Difficulty Very difficult Somewhat difficult Somewhat difficult Somewhat difficult      ROS See pertinent positives and negatives per HPI.    Objective:     BP 110/80 (BP Location: Left Arm, Patient Position: Sitting, Cuff Size: Normal)   Pulse 97   Temp (!) 97.4 F (  36.3 C)   Ht 5' 2 (1.575 m)   Wt 126 lb 6.4 oz (57.3 kg)   LMP 08/11/2024 (Exact Date)   SpO2 98%   BMI 23.12 kg/m    Physical Exam Vitals and nursing note reviewed.  Constitutional:      General: She is not in acute distress.    Appearance: Normal appearance.  HENT:     Head: Normocephalic.  Eyes:     Conjunctiva/sclera: Conjunctivae normal.  Cardiovascular:     Rate and Rhythm: Normal rate and regular rhythm.     Pulses: Normal pulses.     Heart sounds: Normal heart sounds.  Pulmonary:     Effort: Pulmonary effort is normal.     Breath sounds: Normal breath sounds.  Musculoskeletal:     Cervical back: Normal range of motion.  Skin:    General: Skin is warm.  Neurological:     General: No focal deficit present.     Mental Status: She is alert and oriented to person, place, and time.  Psychiatric:        Mood and Affect: Mood normal.        Behavior: Behavior normal.        Thought  Content: Thought content normal.        Judgment: Judgment normal.     Assessment & Plan:   Problem List Items Addressed This Visit       Other   Anxiety - Primary   Chronic, not controlled. Symptoms include hyperventilation, palpitations, shortness of breath, and chest tightness. Zoloft  was discontinued due to nausea and increased anxiety. Hydroxyzine  is prescribed for acute episodes, though it may cause drowsiness, so caution is advised. Non-pharmacological interventions such as meditation, deep breathing, and journaling are encouraged. She is referred to a therapist for ongoing management and support. Information on behavioral health urgent care for acute anxiety episodes is provided.        Return in about 4 weeks (around 09/22/2024) for 1-2 months , Anxiety.    Tinnie DELENA Harada, NP

## 2024-08-25 NOTE — Patient Instructions (Signed)
 It was great to see you!  Reach out to the therapist or let me know if you need a referral   Start hydroxyzine  as needed for anxiety or sleep   [facility] is open 24/7 and is a walk-in urgent care  [facility] [address] [place], [place] [address] [phone]  Let's follow-up in 1-2 months, sooner if you have concerns.  If a referral was placed today, you will be contacted for an appointment. Please note that routine referrals can sometimes take up to 3-4 weeks to process. Please call our office if you haven't heard anything after this time frame.  Take care,  Tinnie Harada, NP

## 2024-09-07 ENCOUNTER — Ambulatory Visit: Admitting: Nurse Practitioner
